# Patient Record
Sex: Female | Born: 2005 | Race: White | Hispanic: Yes | Marital: Single | Smoking: Never smoker
Health system: Southern US, Community
[De-identification: ages and names within clinical notes are randomized; demographics above are authoritative.]

## PROBLEM LIST (undated history)

## (undated) VITALS — BP 98/63 | HR 70 | Temp 98.3°F | Resp 15 | Ht 61.42 in | Wt 99.2 lb

## (undated) DIAGNOSIS — F909 Attention-deficit hyperactivity disorder, unspecified type: Secondary | ICD-10-CM

## (undated) DIAGNOSIS — F329 Major depressive disorder, single episode, unspecified: Secondary | ICD-10-CM

## (undated) DIAGNOSIS — F32A Depression, unspecified: Secondary | ICD-10-CM

## (undated) DIAGNOSIS — F419 Anxiety disorder, unspecified: Secondary | ICD-10-CM

---
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
## 2023-07-14 ENCOUNTER — Other Ambulatory Visit: Payer: Self-pay

## 2023-07-14 ENCOUNTER — Emergency Department (HOSPITAL_COMMUNITY)
Admission: EM | Admit: 2023-07-14 | Discharge: 2023-07-15 | Disposition: A | Discharge status: Home / Self Care | Payer: Medicaid Other | Attending: Emergency Medicine | Admitting: Emergency Medicine

## 2023-07-14 ENCOUNTER — Encounter (HOSPITAL_COMMUNITY): Payer: Self-pay

## 2023-07-14 DIAGNOSIS — J101 Influenza due to other identified influenza virus with other respiratory manifestations: Secondary | ICD-10-CM | POA: Diagnosis not present

## 2023-07-14 DIAGNOSIS — R42 Dizziness and giddiness: Secondary | ICD-10-CM | POA: Diagnosis not present

## 2023-07-14 DIAGNOSIS — Z20822 Contact with and (suspected) exposure to covid-19: Secondary | ICD-10-CM | POA: Diagnosis not present

## 2023-07-14 DIAGNOSIS — J029 Acute pharyngitis, unspecified: Secondary | ICD-10-CM | POA: Diagnosis present

## 2023-07-14 LAB — RESP PANEL BY RT-PCR (RSV, FLU A&B, COVID)  RVPGX2
Influenza A by PCR: POSITIVE — AB
Influenza B by PCR: NEGATIVE
Resp Syncytial Virus by PCR: NEGATIVE
SARS Coronavirus 2 by RT PCR: NEGATIVE

## 2023-07-14 LAB — GROUP A STREP BY PCR: Group A Strep by PCR: NOT DETECTED

## 2023-07-14 NOTE — ED Triage Notes (Signed)
Pt c/o bodyaches, sore throat and dizziness started today

## 2023-07-15 MED ORDER — ONDANSETRON 4 MG PO TBDP: 4.0000 mg | ORAL_TABLET | Freq: Three times a day (TID) | ORAL | 0 refills | Status: AC | PRN

## 2023-07-15 NOTE — ED Provider Notes (Signed)
 [place] EMERGENCY DEPARTMENT AT [facility] Provider Note   CSN: [id number] Arrival date & time: 07/14/23  1746     History  Chief Complaint  Patient presents with   Sore Throat   Dizziness    Isabella Flowers is a 18 y.o. female.  Patient presents the emergency room complaining of bodyaches, sore throat, lightheadedness, nausea without emesis which began earlier today.  She endorses sick contacts at work.  She denies abdominal pain, chest pain, shortness of breath, urinary symptoms, vaginal discharge, known fever.  Past medical history significant for anxiety   Sore Throat  Dizziness      Home Medications Prior to Admission medications   Medication Sig Start Date End Date Taking? Authorizing Provider  ondansetron  (ZOFRAN -ODT) 4 MG disintegrating tablet Take 1 tablet (4 mg total) by mouth every 8 (eight) hours as needed for nausea or vomiting. 07/15/23  Yes Logan Ubaldo NOVAK, PA-C  Selenium  Sulfide 2.25 % SHAM Apply 1 g topically daily. 11/23/17   Wieters, Hallie C, PA-C      Allergies    Seasonal ic [cholestatin]    Review of Systems   Review of Systems  Neurological:  Positive for dizziness.    Physical Exam Updated Vital Signs BP 115/80 (BP Location: Right Arm)   Pulse (!) 118   Temp 98.4 F (36.9 C) (Oral)   Resp 16   Ht 5' 4 (1.626 m)   Wt 62.6 kg   SpO2 99%   BMI 23.69 kg/m  Physical Exam Vitals and nursing note reviewed.  HENT:     Head: Normocephalic and atraumatic.     Mouth/Throat:     Mouth: Mucous membranes are moist.  Eyes:     Pupils: Pupils are equal, round, and reactive to light.  Cardiovascular:     Rate and Rhythm: Normal rate.  Pulmonary:     Effort: Pulmonary effort is normal. No respiratory distress.  Musculoskeletal:        General: No signs of injury.     Cervical back: Normal range of motion.  Skin:    General: Skin is dry.  Neurological:     Mental Status: She is alert.  Psychiatric:        Speech:  Speech normal.        Behavior: Behavior normal.     ED Results / Procedures / Treatments   Labs (all labs ordered are listed, but only abnormal results are displayed) Labs Reviewed  RESP PANEL BY RT-PCR (RSV, FLU A&B, COVID)  RVPGX2 - Abnormal; Notable for the following components:      Result Value   Influenza A by PCR POSITIVE (*)    All other components within normal limits  GROUP A STREP BY PCR    EKG None  Radiology No results found.  Procedures Procedures    Medications Ordered in ED Medications - No data to display  ED Course/ Medical Decision Making/ A&P                                 Medical Decision Making  This patient presents to the ED for concern of bodyaches, sore throat, this involves an extensive number of treatment options, and is a complaint that carries with it a high risk of complications and morbidity.  The differential diagnosis includes strep throat, RSV, influenza, COVID, others   Co morbidities that complicate the patient evaluation  Anxiety   Lab  Tests:  I Ordered, and personally interpreted labs.  The pertinent results include: Positive influenza A test result   Imaging Studies ordered:  The patient has no abdominal tenderness or shortness of breath to require imaging at this time   Social Determinants of Health:  The patient has Medicaid for her primary health insurance type   Test / Admission - Considered:  Patient tested positive for influenza A.  This is consistent with her history and physical.  I discussed supportive care at home including acetaminophen  and ibuprofen  usage for fever and pain control.  I will prescribe a course of Zofran  for nausea to be used as needed.  Discharged home in stable condition with return precautions.         Final Clinical Impression(s) / ED Diagnoses Final diagnoses:  Influenza A    Rx / DC Orders ED Discharge Orders          Ordered    ondansetron  (ZOFRAN -ODT) 4 MG  disintegrating tablet  Every 8 hours PRN        07/15/23 0136              Logan Ubaldo NOVAK, PA-C 07/15/23 9862    Melvenia Motto, MD 07/15/23 [phone]

## 2023-07-15 NOTE — Discharge Instructions (Addendum)
 You tested positive today for influenza.  Please use supportive care measures at home including acetaminophen  and ibuprofen  usage for fever and pain control, drink plenty of fluids, and rest.  I have provided Zofran , a medication to help with nausea to be used as needed.  If you develop any life-threatening symptoms return to the emergency department, otherwise, please follow-up as needed with your primary care provider.
